# Patient Record
Sex: Male | Born: 2000 | Race: Black or African American | Hispanic: No | Marital: Single | Smoking: Never smoker
Health system: Southern US, Community
[De-identification: ages and names within clinical notes are randomized; demographics above are authoritative.]

## PROBLEM LIST (undated history)

## (undated) DIAGNOSIS — R011 Cardiac murmur, unspecified: Secondary | ICD-10-CM

---
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
## 2023-03-08 ENCOUNTER — Ambulatory Visit
Admission: EM | Admit: 2023-03-08 | Discharge: 2023-03-08 | Disposition: A | Discharge status: Home / Self Care | Payer: Medicaid Other | Attending: Nurse Practitioner | Admitting: Nurse Practitioner

## 2023-03-08 ENCOUNTER — Other Ambulatory Visit: Payer: Self-pay

## 2023-03-08 DIAGNOSIS — R55 Syncope and collapse: Secondary | ICD-10-CM

## 2023-03-08 DIAGNOSIS — H66001 Acute suppurative otitis media without spontaneous rupture of ear drum, right ear: Secondary | ICD-10-CM

## 2023-03-08 LAB — POCT FASTING CBG KUC MANUAL ENTRY: POCT Glucose (KUC): 91 mg/dL (ref 70–99)

## 2023-03-08 MED ORDER — AMOXICILLIN 875 MG PO TABS: 875.0000 mg | ORAL_TABLET | Freq: Two times a day (BID) | ORAL | 0 refills | Status: AC

## 2023-03-08 NOTE — Discharge Instructions (Signed)
We will contact you tomorrow if the blood work from today comes back abnormal.  In the meantime, recommend increasing hydration with water or sugar-free electrolyte solutions.  Also recommend changing positions slowly and do not go from laying to standing/walking without taking a couple seconds to get your bearings.  Your right ear appears infected-take the amoxicillin twice daily for 5 days as prescribed to treat it.  Seek care if symptoms do not improve with treatment.  If symptoms recur or worsen, please seek care in the emergency room.

## 2023-03-08 NOTE — ED Triage Notes (Signed)
Pt states he got up this morning and went to the bathroom and passed out and woke up on the floor. Pt states he had a headache but Advil and it helped, now just feeling tired and having blurry vision.

## 2023-03-08 NOTE — ED Provider Notes (Signed)
[facility]    CSN: [id number] Arrival date & time: 03/08/23  0856      History   Chief Complaint Chief Complaint  Patient presents with   Dizziness    HPI Terry Cohen is a 22 y.o. male.   Patient presents today for a passing out episode that occurred this morning when he woke up for work.  Reports he woke up and thought he was late for work so ran to the bathroom and woke up on the bathroom floor shortly thereafter.  Reports prior to the episode, he did have some blurred/double vision and lightheadedness prior to passing out.  He denies history of similar, recent head injury, or recent viral symptoms.  Symptoms have not recurred since this morning and are not worse with turning the head back and forth or bending over.  No nausea/vomiting, recent ringing in the ears, or aural fullness.  He did have a slight headache this morning that he took Advil for this morning that completely improved the headache.  No current photophobia, phonophobia, unsteady gait or postural instability, diplopia, dysarthria, dysphagia, weakness, pallor, chest pain, or shortness of breath.  Reports yesterday, he was in his normal state of health.    Patient does endorse right sided ear pain for the past 2 weeks or so.  Denies ear drainage or change in hearing from the right ear.    Past Medical History:  Diagnosis Date   Heart murmur    "innocent murmur"    There are no problems to display for this patient.   History reviewed. No pertinent surgical history.     Home Medications    Prior to Admission medications   Medication Sig Start Date End Date Taking? Authorizing Provider  amoxicillin (AMOXIL) 875 MG tablet Take 1 tablet (875 mg total) by mouth 2 (two) times daily for 5 days. 03/08/23 03/13/23 Yes Valentino Nose, NP  ondansetron (ZOFRAN) 4 MG tablet Take 1 tablet (4 mg total) by mouth every 8 (eight) hours as needed for nausea or vomiting. 12/19/21   Burgess Amor, PA-C     Family History History reviewed. No pertinent family history.  Social History Social History   Tobacco Use   Smoking status: Never  Vaping Use   Vaping status: Never Used  Substance Use Topics   Alcohol use: No   Drug use: Not Currently     Allergies   Patient has no known allergies.   Review of Systems Review of Systems Per HPI  Physical Exam Triage Vital Signs ED Triage Vitals  Encounter Vitals Group     BP 03/08/23 0911 131/70     Systolic BP Percentile --      Diastolic BP Percentile --      Pulse Rate 03/08/23 0911 75     Resp 03/08/23 0911 18     Temp 03/08/23 0911 99.1 F (37.3 C)     Temp Source 03/08/23 0911 Oral     SpO2 03/08/23 0911 97 %     Weight --      Height --      Head Circumference --      Peak Flow --      Pain Score 03/08/23 0925 0     Pain Loc --      Pain Education --      Exclude from Growth Chart --    Orthostatic VS for the past 24 hrs:  BP- Lying Pulse- Lying BP- Sitting Pulse- Sitting BP- Standing  at 0 minutes Pulse- Standing at 0 minutes  03/08/23 0921 127/73 65 125/75 71 125/78 93    Updated Vital Signs BP 131/70 (BP Location: Right Arm)   Pulse 75   Temp 99.1 F (37.3 C) (Oral)   Resp 18   SpO2 97%   Visual Acuity Right Eye Distance: 20/20 Left Eye Distance: 20/20 Bilateral Distance: 20/20  Right Eye Near:   Left Eye Near:    Bilateral Near:     Physical Exam Vitals and nursing note reviewed.  Constitutional:      General: He is not in acute distress.    Appearance: Normal appearance. He is not ill-appearing, toxic-appearing or diaphoretic.  HENT:     Head: Normocephalic and atraumatic.     Right Ear: Tympanic membrane, ear canal and external ear normal. There is no impacted cerumen.     Left Ear: Tympanic membrane, ear canal and external ear normal. There is no impacted cerumen.     Nose: Nose normal. No congestion or rhinorrhea.     Mouth/Throat:     Mouth: Mucous membranes are moist.     Pharynx:  Oropharynx is clear. No posterior oropharyngeal erythema.  Eyes:     General: No scleral icterus.    Extraocular Movements: Extraocular movements intact.     Pupils: Pupils are equal, round, and reactive to light.  Cardiovascular:     Rate and Rhythm: Normal rate and regular rhythm.  Pulmonary:     Effort: Pulmonary effort is normal. No respiratory distress.     Breath sounds: Normal breath sounds. No wheezing, rhonchi or rales.  Abdominal:     General: Abdomen is flat. Bowel sounds are normal. There is no distension.     Palpations: Abdomen is soft.     Tenderness: There is no abdominal tenderness. There is no right CVA tenderness, left CVA tenderness or guarding.  Musculoskeletal:     Cervical back: Normal range of motion and neck supple. No rigidity or tenderness.  Lymphadenopathy:     Cervical: No cervical adenopathy.  Skin:    General: Skin is warm and dry.     Capillary Refill: Capillary refill takes less than 2 seconds.     Coloration: Skin is not jaundiced or pale.     Findings: No erythema.  Neurological:     General: No focal deficit present.     Mental Status: He is alert and oriented to person, place, and time.     Cranial Nerves: Cranial nerves 2-12 are intact. No cranial nerve deficit.     Sensory: Sensation is intact.     Motor: No weakness.     Coordination: Coordination is intact. Coordination normal. Heel to [facility] Test normal. Rapid alternating movements normal.     Gait: Gait is intact. Gait normal.  Psychiatric:        Behavior: Behavior is cooperative.      UC Treatments / Results  Labs (all labs ordered are listed, but only abnormal results are displayed) Labs Reviewed  CBC  COMPREHENSIVE METABOLIC PANEL  POCT FASTING CBG [facility] MANUAL ENTRY    EKG   Radiology No results found.  Procedures Procedures (including critical care time)  Medications Ordered in UC Medications - No data to display  Initial Impression / Assessment and Plan / UC  Course  I have reviewed the triage vital signs and the nursing notes.  Pertinent labs & imaging results that were available during my care of the patient were reviewed by me and  considered in my medical decision making (see chart for details).   Patient is well-appearing, normotensive, afebrile, not tachycardic, not tachypneic, oxygenating well on room air.   1. Non-recurrent acute suppurative otitis media of right ear without spontaneous rupture of tympanic membrane Treat with Amoxicillin twice daily for 5 days Unclear if this is related to syncopal episode earlier today Continue supportive care  2. Syncope, unspecified syncope type Overall, examination and vital signs are reassuring CBG is not low EKG is reassuring without significant ST or T wave abnormalities; there is likely some early repolarization in leads aVL, V2, and V3 Patient is neurologically intact CBC and CMET obtained for rule out of metabolic cause Recommended increasing hydration and changing positions slowly If symptoms recur, recommended evaluation in ER  The patient was given the opportunity to ask questions.  All questions answered to their satisfaction.  The patient is in agreement to this plan.    Final Clinical Impressions(s) / UC Diagnoses   Final diagnoses:  Non-recurrent acute suppurative otitis media of right ear without spontaneous rupture of tympanic membrane  Syncope, unspecified syncope type     Discharge Instructions      We will contact you tomorrow if the blood work from today comes back abnormal.  In the meantime, recommend increasing hydration with water or sugar-free electrolyte solutions.  Also recommend changing positions slowly and do not go from laying to standing/walking without taking a couple seconds to get your bearings.  Your right ear appears infected-take the amoxicillin twice daily for 5 days as prescribed to treat it.  Seek care if symptoms do not improve with treatment.  If  symptoms recur or worsen, please seek care in the emergency room.     ED Prescriptions     Medication Sig Dispense Auth. Provider   amoxicillin (AMOXIL) 875 MG tablet Take 1 tablet (875 mg total) by mouth 2 (two) times daily for 5 days. 10 tablet Valentino Nose, NP      PDMP not reviewed this encounter.   Valentino Nose, NP 03/08/23 1027

## 2023-03-09 LAB — COMPREHENSIVE METABOLIC PANEL
ALT: 8 [IU]/L (ref 0–44)
AST: 16 [IU]/L (ref 0–40)
Albumin: 4.5 g/dL (ref 4.3–5.2)
Alkaline Phosphatase: 92 [IU]/L (ref 51–125)
BUN/Creatinine Ratio: 12 (ref 9–20)
BUN: 14 mg/dL (ref 6–20)
Bilirubin Total: 0.3 mg/dL (ref 0.0–1.2)
CO2: 22 mmol/L (ref 20–29)
Calcium: 10.7 mg/dL — ABNORMAL HIGH (ref 8.7–10.2)
Chloride: 101 mmol/L (ref 96–106)
Creatinine, Ser: 1.15 mg/dL (ref 0.76–1.27)
Globulin, Total: 2.5 g/dL (ref 1.5–4.5)
Glucose: 94 mg/dL (ref 70–99)
Potassium: 4.7 mmol/L (ref 3.5–5.2)
Sodium: 136 mmol/L (ref 134–144)
Total Protein: 7 g/dL (ref 6.0–8.5)
eGFR: 93 mL/min/{1.73_m2} (ref >59)

## 2023-03-09 LAB — CBC
Hematocrit: 45.2 % (ref 37.5–51.0)
Hemoglobin: 14.3 g/dL (ref 13.0–17.7)
MCH: 27.8 pg (ref 26.6–33.0)
MCHC: 31.6 g/dL (ref 31.5–35.7)
MCV: 88 fL (ref 79–97)
Platelets: 231 10*3/uL (ref 150–450)
RBC: 5.14 x10E6/uL (ref 4.14–5.80)
RDW: 12.7 % (ref 11.6–15.4)
WBC: 5.1 10*3/uL (ref 3.4–10.8)

## 2023-06-21 ENCOUNTER — Other Ambulatory Visit: Payer: Self-pay

## 2023-06-21 ENCOUNTER — Emergency Department (HOSPITAL_COMMUNITY)
Admission: EM | Admit: 2023-06-21 | Discharge: 2023-06-21 | Disposition: A | Discharge status: Home / Self Care | Payer: Worker's Compensation | Attending: Emergency Medicine | Admitting: Emergency Medicine

## 2023-06-21 ENCOUNTER — Encounter (HOSPITAL_COMMUNITY): Payer: Self-pay

## 2023-06-21 ENCOUNTER — Emergency Department (HOSPITAL_COMMUNITY): Payer: Self-pay

## 2023-06-21 DIAGNOSIS — R55 Syncope and collapse: Secondary | ICD-10-CM | POA: Insufficient documentation

## 2023-06-21 LAB — RAPID URINE DRUG SCREEN, HOSP PERFORMED
Amphetamines: NOT DETECTED
Barbiturates: NOT DETECTED
Benzodiazepines: NOT DETECTED
Cocaine: NOT DETECTED
Opiates: NOT DETECTED
Tetrahydrocannabinol: NOT DETECTED

## 2023-06-21 LAB — URINALYSIS, ROUTINE W REFLEX MICROSCOPIC
Bilirubin Urine: NEGATIVE
Glucose, UA: NEGATIVE mg/dL
Hgb urine dipstick: NEGATIVE
Ketones, ur: NEGATIVE mg/dL
Leukocytes,Ua: NEGATIVE
Nitrite: NEGATIVE
Protein, ur: NEGATIVE mg/dL
Specific Gravity, Urine: 1.009 (ref 1.005–1.030)
pH: 6 (ref 5.0–8.0)

## 2023-06-21 LAB — COMPREHENSIVE METABOLIC PANEL
ALT: 9 U/L (ref 0–44)
AST: 17 U/L (ref 15–41)
Albumin: 4 g/dL (ref 3.5–5.0)
Alkaline Phosphatase: 63 U/L (ref 38–126)
Anion gap: 9 (ref 5–15)
BUN: 15 mg/dL (ref 6–20)
CO2: 26 mmol/L (ref 22–32)
Calcium: 10.4 mg/dL — ABNORMAL HIGH (ref 8.9–10.3)
Chloride: 101 mmol/L (ref 98–111)
Creatinine, Ser: 1.13 mg/dL (ref 0.61–1.24)
GFR, Estimated: >60 mL/min (ref >60)
Glucose, Bld: 104 mg/dL — ABNORMAL HIGH (ref 70–99)
Potassium: 3.9 mmol/L (ref 3.5–5.1)
Sodium: 136 mmol/L (ref 135–145)
Total Bilirubin: 0.6 mg/dL (ref 0.0–1.2)
Total Protein: 7.2 g/dL (ref 6.5–8.1)

## 2023-06-21 LAB — CBC WITH DIFFERENTIAL/PLATELET
Abs Immature Granulocytes: 0.01 10*3/uL (ref 0.00–0.07)
Basophils Absolute: 0 10*3/uL (ref 0.0–0.1)
Basophils Relative: 0 %
Eosinophils Absolute: 0 10*3/uL (ref 0.0–0.5)
Eosinophils Relative: 0 %
HCT: 43.7 % (ref 39.0–52.0)
Hemoglobin: 14 g/dL (ref 13.0–17.0)
Immature Granulocytes: 0 %
Lymphocytes Relative: 41 %
Lymphs Abs: 2 10*3/uL (ref 0.7–4.0)
MCH: 27.7 pg (ref 26.0–34.0)
MCHC: 32 g/dL (ref 30.0–36.0)
MCV: 86.4 fL (ref 80.0–100.0)
Monocytes Absolute: 0.6 10*3/uL (ref 0.1–1.0)
Monocytes Relative: 12 %
Neutro Abs: 2.2 10*3/uL (ref 1.7–7.7)
Neutrophils Relative %: 47 %
Platelets: 236 10*3/uL (ref 150–400)
RBC: 5.06 MIL/uL (ref 4.22–5.81)
RDW: 13.1 % (ref 11.5–15.5)
WBC: 4.8 10*3/uL (ref 4.0–10.5)
nRBC: 0 % (ref 0.0–0.2)

## 2023-06-21 LAB — TROPONIN I (HIGH SENSITIVITY)
Troponin I (High Sensitivity): <2 ng/L (ref <18)
Troponin I (High Sensitivity): <2 ng/L (ref <18)

## 2023-06-21 LAB — CK: Total CK: 99 U/L (ref 49–397)

## 2023-06-21 LAB — CBG MONITORING, ED: Glucose-Capillary: 100 mg/dL — ABNORMAL HIGH (ref 70–99)

## 2023-06-21 MED ORDER — LACTATED RINGERS IV BOLUS
1000.0000 mL | Freq: Once | INTRAVENOUS | Status: AC
  Administered 2023-06-21: 1000 mL via INTRAVENOUS

## 2023-06-21 NOTE — ED Notes (Signed)
 Unable to give urine sample at this time.

## 2023-06-21 NOTE — ED Notes (Signed)
 Pt ambulated to ED room. Pt stated he passed out at work. Denies feeling any discomfort before passing out. Stated this has happened before when he had an ear infection. Pt stated he completed all antibiotics for ear infection. Unable to give a timeframe of ear infection.

## 2023-06-21 NOTE — ED Provider Notes (Signed)
 [facility] Provider Note   CSN: [id number] Arrival date & time: 06/21/23  1140     History Chief Complaint  Patient presents with   Loss of Consciousness    Terry Cohen is a 23 y.o. male self reportedly otherwise healthy presents emerged part today for evaluation after syncopal episode.  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  He reports he started to feel hot and lightheaded like he was get a pass out.  He reports he called someone over to help to get spots that he can go get a drink of water but passed out.  Denies any fecal or urinary incontinence.  He reports that he feels back to normal now although is a little tired.  Did not have any chest pain, shortness breath, or palpitations before or after.  He denies any headache.  Unsure if he hit his head or not however he did wake up on the ground.  Not experiencing any weakness, trouble walking, trouble talking, visual changes, or numbness or tingling.  He reports he did have a syncopal episode while getting ready for work in the bathroom a few months prior.  Denies any other history of this.  He reports that the last he ate was yesterday afternoon.  He does wake up [place] at work at 4:00 in the morning.  He denies drinking or staying well-hydrated he should. No known drug allergies.  Denies any tobacco, EtOH illicit drug use.   Loss of Consciousness Associated symptoms: no chest pain, no fever, no headaches, no nausea, no palpitations, no seizures, no shortness of breath, no vomiting and no weakness        Home Medications Prior to Admission medications   Medication Sig Start Date End Date Taking? Authorizing Provider  ondansetron (ZOFRAN) 4 MG tablet Take 1 tablet (4 mg total) by mouth every 8 (eight) hours as needed for nausea or vomiting. 12/19/21   Burgess Amor, PA-C      Allergies    Patient has no known allergies.    Review of Systems    Review of Systems  Constitutional:  Negative for chills and fever.  HENT:  Negative for congestion and rhinorrhea.   Eyes:  Negative for photophobia and visual disturbance.  Respiratory:  Negative for cough and shortness of breath.   Cardiovascular:  Positive for syncope. Negative for chest pain, palpitations and leg swelling.  Gastrointestinal:  Negative for abdominal pain, constipation, diarrhea, nausea and vomiting.  Genitourinary:  Negative for dysuria and hematuria.  Musculoskeletal:  Negative for back pain, gait problem and neck pain.  Neurological:  Positive for syncope and light-headedness. Negative for seizures, speech difficulty, weakness and headaches.    Physical Exam Updated Vital Signs BP 126/80   Pulse 64   Temp 98.4 F (36.9 C) (Oral)   Resp 20   Ht 5\' 9"  (1.753 m)   Wt 107.7 kg   SpO2 99%   BMI 35.07 kg/m  Physical Exam Constitutional:      General: He is not in acute distress.    Appearance: He is not ill-appearing or toxic-appearing.  HENT:     Head: Normocephalic and atraumatic.     Comments: No battle signs or raccoon eyes.  Scalp nontender to palpation.  No step-offs or deformities.  No facial tenderness.    Nose: Nose normal.     Mouth/Throat:     Mouth: Mucous membranes are moist.  Comments: Dentition intact and nontender.  No pain with clenching of teeth or opening closing jaw.  No trismus.  Patient does have a small abrasion to the inner mucosa of the upper lip.  No bleeding. Eyes:     General: No scleral icterus.    Extraocular Movements: Extraocular movements intact.     Pupils: Pupils are equal, round, and reactive to light.  Neck:     Comments: No midline or paraspinal tenderness palpation.  No step-offs or deformities.  No overlying symptoms or signs of trauma. Cardiovascular:     Rate and Rhythm: Normal rate.     Pulses:          Radial pulses are 2+ on the right side and 2+ on the left side.       Dorsalis pedis pulses are 2+ on the  right side and 2+ on the left side.       Posterior tibial pulses are 2+ on the right side and 2+ on the left side.  Pulmonary:     Effort: Pulmonary effort is normal. No respiratory distress.     Breath sounds: Normal breath sounds.  Chest:     Chest wall: No tenderness.  Abdominal:     Palpations: Abdomen is soft.     Tenderness: There is no abdominal tenderness. There is no guarding or rebound.  Musculoskeletal:     Cervical back: Normal range of motion. No tenderness.  Neurological:     Mental Status: He is alert.     ED Results / Procedures / Treatments   Labs (all labs ordered are listed, but only abnormal results are displayed) Labs Reviewed  COMPREHENSIVE METABOLIC PANEL - Abnormal; Notable for the following components:      Result Value   Glucose, Bld 104 (*)    Calcium 10.4 (*)    All other components within normal limits  CBG MONITORING, ED - Abnormal; Notable for the following components:   Glucose-Capillary 100 (*)    All other components within normal limits  CK  CBC WITH DIFFERENTIAL/PLATELET  URINALYSIS, ROUTINE W REFLEX MICROSCOPIC  RAPID URINE DRUG SCREEN, HOSP PERFORMED  TROPONIN I (HIGH SENSITIVITY)  TROPONIN I (HIGH SENSITIVITY)    EKG EKG Interpretation Date/Time:  Monday June 21 2023 12:36:03 EDT Ventricular Rate:  74 PR Interval:  145 QRS Duration:  91 QT Interval:  357 QTC Calculation: 396 R Axis:   64  Text Interpretation: Sinus rhythm Consider left atrial enlargement Borderline ST elevation, anterolateral leads Confirmed by Gilda Crease [phone]) on 06/23/2023 12:15:44 AM  Radiology CT Head Wo Contrast Result Date: 06/21/2023 CLINICAL DATA:  Provided history: Head trauma, moderate/severe. Neck trauma, dangerous injury mechanism. Additional history provided: syncopal episode at work. EXAM: CT HEAD WITHOUT CONTRAST CT CERVICAL SPINE WITHOUT CONTRAST TECHNIQUE: Multidetector CT imaging of the head and cervical spine was performed  following the standard protocol without intravenous contrast. Multiplanar CT image reconstructions of the cervical spine were also generated. RADIATION DOSE REDUCTION: This exam was performed according to the departmental dose-optimization program which includes automated exposure control, adjustment of the mA and/or kV according to patient size and/or use of iterative reconstruction technique. COMPARISON:  None. FINDINGS: CT HEAD FINDINGS Brain: Cerebral volume is normal. There is no acute intracranial hemorrhage. No demarcated cortical infarct. No extra-axial fluid collection. No evidence of an intracranial mass. No midline shift. Vascular: No hyperdense vessel. Skull: No calvarial fracture or aggressive osseous lesion. Sinuses/Orbits: No mass or acute finding within the imaged orbits.  Moderate right maxillary sinusitis. Moderate mucosal thickening within the left maxillary sinus. Mild mucosal thickening, and small-volume secretions, within the right frontal sinus. CT CERVICAL SPINE FINDINGS Alignment: Nonspecific straightening of the expected cervical lordosis. No significant spondylolisthesis. Skull base and vertebrae: The basion-dental and atlanto-dental intervals are maintained.No evidence of acute fracture to the cervical spine. Soft tissues and spinal canal: No prevertebral fluid or swelling. No visible canal hematoma. Disc levels: No significant spinal canal stenosis is appreciated. No significant bony neural foraminal narrowing. Upper chest: No consolidation within the imaged lung apices. No visible pneumothorax. IMPRESSION: CT head: 1.  No evidence of an acute intracranial abnormality. 2. Paranasal sinus disease at the imaged levels, as described. CT cervical spine: 1. No evidence of an acute cervical spine fracture. 2. Nonspecific straightening of the expected cervical lordosis. Electronically Signed   By: Jackey Loge D.O.   On: 06/21/2023 14:38   CT Cervical Spine Wo Contrast Result Date:  06/21/2023 CLINICAL DATA:  Provided history: Head trauma, moderate/severe. Neck trauma, dangerous injury mechanism. Additional history provided: syncopal episode at work. EXAM: CT HEAD WITHOUT CONTRAST CT CERVICAL SPINE WITHOUT CONTRAST TECHNIQUE: Multidetector CT imaging of the head and cervical spine was performed following the standard protocol without intravenous contrast. Multiplanar CT image reconstructions of the cervical spine were also generated. RADIATION DOSE REDUCTION: This exam was performed according to the departmental dose-optimization program which includes automated exposure control, adjustment of the mA and/or kV according to patient size and/or use of iterative reconstruction technique. COMPARISON:  None. FINDINGS: CT HEAD FINDINGS Brain: Cerebral volume is normal. There is no acute intracranial hemorrhage. No demarcated cortical infarct. No extra-axial fluid collection. No evidence of an intracranial mass. No midline shift. Vascular: No hyperdense vessel. Skull: No calvarial fracture or aggressive osseous lesion. Sinuses/Orbits: No mass or acute finding within the imaged orbits. Moderate right maxillary sinusitis. Moderate mucosal thickening within the left maxillary sinus. Mild mucosal thickening, and small-volume secretions, within the right frontal sinus. CT CERVICAL SPINE FINDINGS Alignment: Nonspecific straightening of the expected cervical lordosis. No significant spondylolisthesis. Skull base and vertebrae: The basion-dental and atlanto-dental intervals are maintained.No evidence of acute fracture to the cervical spine. Soft tissues and spinal canal: No prevertebral fluid or swelling. No visible canal hematoma. Disc levels: No significant spinal canal stenosis is appreciated. No significant bony neural foraminal narrowing. Upper chest: No consolidation within the imaged lung apices. No visible pneumothorax. IMPRESSION: CT head: 1.  No evidence of an acute intracranial abnormality. 2.  Paranasal sinus disease at the imaged levels, as described. CT cervical spine: 1. No evidence of an acute cervical spine fracture. 2. Nonspecific straightening of the expected cervical lordosis. Electronically Signed   By: Jackey Loge D.O.   On: 06/21/2023 14:38    Procedures Procedures   Medications Ordered in ED Medications - No data to display  ED Course/ Medical Decision Making/ A&P                              Medical Decision Making Amount and/or Complexity of Data Reviewed Labs: ordered. Radiology: ordered.   23 y.o. male presents to the ER for evaluation of syncopal episode. Differential diagnosis includes but is not limited to CVA, ACS, arrhythmia, vasovagal / orthostatic hypotension, sepsis, hypoglycemia, electrolyte disturbance, respiratory failure, anemia, dehydration, heat injury, polypharmacy, malignancy, anxiety/panic attack. Vital signs . Physical exam as noted above.    EKG reviewed and interpreted by my attending and read  as  Sinus rhythm Consider left atrial enlargement Borderline ST elevation, anterolateral leads .  I independently reviewed and interpreted the patient's labs. Urinalysis unremarkable. UDS negative. CMP shows glucose 104 and mildly elevated calcium at 10.4. CBC without leukocytosis or anemia.  CK within normal limits.  CBG at 100.  Troponin less than 2 with repeat at less than 2.  CT head and c-spine shows  1.  No evidence of an acute intracranial abnormality. 2. Paranasal sinus disease at the imaged levels, as described. CT cervical spine: 1. No evidence of an acute cervical spine fracture. 2. Nonspecific straightening of the expected cervical lordosis. Per radiologist's interpretation.     Patient was not experiencing any chest pain or shortness breath before or after the syncopal episode.  He reports that he was standing there and started to feel lightheaded and hot like he was going to pass out.  He reports that he called someone over and then  passed out.  Unsure how long he was out for.  Did not urinate or defecate on himself.  No injury to the tongue.  Coworker that he was with, per patient, reported that he did not have any seizure-like or shaking activity.  He thinks he was out for a few minutes.  He reports he feels tired but back to normal.  Denies any recent illnesses.  No nausea or vomiting.  Denies any headache.  Did have a little bit of upper lip pain.  No dental pain.  He is unsure if he hit his head however he woke up on the ground.  Not complaining of any head or neck pain currently.  No tenderness to any extremities, back, chest, or belly.  He reports he did have a syncopal episode a few months prior while he was getting ready to go to work.  The patient also ports that he did not eat anything this morning, last he ate was yesterday afternoon.  This could be mixed with some hypoglycemia, dehydration, or exhaustion.  Patient after some fluids has been ambulatory without any issue.  Able to go from a lying to standing position without any recurrence of symptoms.  He was given food and drink here and was tolerated well.  Reports that he feels better not as hungry as well.  I would like for him to follow-up with his primary care doctor regarding this.  This sounds like vasovagal syncope.  He does have some borderline orthostatic hypotension which is why 1 L of fluids was given.  I doubt any ACS causing this given normal troponins and EKG.  EKG shows normal sinus rhythm.  There is no significant electrolyte derangement.  Mildly elevated calcium however could be through some dehydration.  Overall, he is stable for discharge home with close outpatient follow-up and strict return precautions.  We discussed the results of the labs/imaging. The plan is follow up with PCP, eat regular meals, stay well hydrated. We discussed strict return precautions and red flag symptoms. The patient verbalized their understanding and agrees to the plan. The patient  is stable and being discharged home in good condition.  Portions of this report may have been transcribed using voice recognition software. Every effort was made to ensure accuracy; however, inadvertent computerized transcription errors may be present.   Final Clinical Impression(s) / ED Diagnoses Final diagnoses:  Vasovagal syncope    Rx / DC Orders ED Discharge Orders     None         Achille Rich, [place]  06/26/23 1353    Gerhard Munch, MD 06/26/23 1413

## 2023-06-21 NOTE — ED Notes (Signed)
 Patient transported to CT

## 2023-06-21 NOTE — ED Triage Notes (Signed)
 Pt arrived via POV from work following a syncopal episode. Pt reports he was standing up waiting to use a machine, but then woke up on the floor, unsure what had happened.

## 2023-06-21 NOTE — Discharge Instructions (Signed)
Contact a doctor if:  You have episodes of near fainting.  Get help right away if:  You pass out or faint.  You hit your head or are injured after fainting.  You have any of these symptoms:  Fast or uneven heartbeats (palpitations).  Pain in your chest, belly, or back.  Shortness of breath.  You have jerky movements that you cannot control (seizure).  You have a very bad headache.  You are confused.  You have problems with how you see (vision).  You are very weak.  You have trouble walking.  You are bleeding from your mouth or your butt (rectum).  You have black or tarry poop (stool).  These symptoms may be an emergency. Get help right away. Call your local emergency services (911 in the [place]  Do not wait to see if the symptoms will go away.  Do not drive yourself to the hospital.
